# Patient Record
Sex: Male | Born: 1990 | Hispanic: No | Marital: Single | Smoking: Never smoker
Health system: Southern US, Community
[De-identification: ages and names within clinical notes are randomized; demographics above are authoritative.]

## PROBLEM LIST (undated history)

## (undated) DIAGNOSIS — J3089 Other allergic rhinitis: Secondary | ICD-10-CM

---
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
## 2016-02-28 ENCOUNTER — Emergency Department (HOSPITAL_COMMUNITY): Payer: Self-pay

## 2016-02-28 ENCOUNTER — Encounter (HOSPITAL_COMMUNITY): Payer: Self-pay

## 2016-02-28 ENCOUNTER — Emergency Department (HOSPITAL_COMMUNITY)
Admission: EM | Admit: 2016-02-28 | Discharge: 2016-02-28 | Disposition: A | Discharge status: Home / Self Care | Payer: Self-pay | Attending: Emergency Medicine | Admitting: Emergency Medicine

## 2016-02-28 ENCOUNTER — Emergency Department (HOSPITAL_COMMUNITY): Admit: 2016-02-28 | Discharge: 2016-02-28 | Discharge status: Against Medical Advice | Payer: Self-pay

## 2016-02-28 DIAGNOSIS — Y929 Unspecified place or not applicable: Secondary | ICD-10-CM | POA: Insufficient documentation

## 2016-02-28 DIAGNOSIS — X501XXA Overexertion from prolonged static or awkward postures, initial encounter: Secondary | ICD-10-CM | POA: Insufficient documentation

## 2016-02-28 DIAGNOSIS — Y999 Unspecified external cause status: Secondary | ICD-10-CM | POA: Insufficient documentation

## 2016-02-28 DIAGNOSIS — S93401A Sprain of unspecified ligament of right ankle, initial encounter: Secondary | ICD-10-CM | POA: Insufficient documentation

## 2016-02-28 DIAGNOSIS — Y939 Activity, unspecified: Secondary | ICD-10-CM | POA: Insufficient documentation

## 2016-02-28 HISTORY — DX: Other allergic rhinitis: J30.89

## 2016-02-28 MED ORDER — IBUPROFEN 800 MG PO TABS
800.0000 mg | ORAL_TABLET | Freq: Once | ORAL | Status: AC
  Administered 2016-02-28: 800 mg via ORAL
  Filled 2016-02-28: qty 1

## 2016-02-28 NOTE — ED Provider Notes (Signed)
[facility]-EMERGENCY DEPT Provider Note   CSN: [id number] Arrival date & time: 02/28/16  0357    History   Chief Complaint Chief Complaint  Patient presents with  . Ankle Pain    HPI Joseph Walsh is a 26 y.o. male.  The history is provided by the patient. No language interpreter was used.  Ankle Pain   The incident occurred 6 to 12 hours ago. Injury mechanism: twisted ankle. The pain is present in the right ankle. The quality of the pain is described as throbbing and aching. The pain is mild. The pain has been constant since onset. Pertinent negatives include no muscle weakness and no tingling. He reports no foreign bodies present. The symptoms are aggravated by bearing weight. He has tried elevation for the symptoms. The treatment provided no relief.    Past Medical History:  Diagnosis Date  . Environmental and seasonal allergies     There are no active problems to display for this patient.   History reviewed. No pertinent surgical history.   Home Medications    Prior to Admission medications   Not on File    Family History No family history on file.  Social History Social History  Substance Use Topics  . Smoking status: Never Smoker  . Smokeless tobacco: Never Used  . Alcohol use Yes     Allergies   Patient has no allergy information on record.   Review of Systems Review of Systems  Neurological: Negative for tingling.  Ten systems reviewed and are negative for acute change, except as noted in the HPI.    Physical Exam Updated Vital Signs BP 113/59 (BP Location: Right Arm)   Pulse 65   Temp 97.9 F (36.6 C) (Oral)   Resp 18   SpO2 97%   Physical Exam  Constitutional: He is oriented to person, place, and time. He appears well-developed and well-nourished. No distress.  Nontoxic and in NAD  HENT:  Head: Normocephalic and atraumatic.  Eyes: Conjunctivae and EOM are normal. No scleral icterus.  Neck: Normal range of motion.  Cardiovascular: Normal  rate, regular rhythm and intact distal pulses.   DP pulse 2+ in the RLE  Pulmonary/Chest: Effort normal. No respiratory distress.  Musculoskeletal: He exhibits tenderness.       Right ankle: He exhibits swelling. He exhibits normal range of motion, no deformity, no laceration and normal pulse. Tenderness. Achilles tendon normal.  Neurological: He is alert and oriented to person, place, and time. He exhibits normal muscle tone. Coordination normal.  Sensation to light touch intact in the RLE  Skin: Skin is warm and dry. No rash noted. He is not diaphoretic. No erythema. No pallor.  Psychiatric: He has a normal mood and affect. His behavior is normal.  Nursing note and vitals reviewed.    ED Treatments / Results  Labs (all labs ordered are listed, but only abnormal results are displayed) Labs Reviewed - No data to display  EKG  EKG Interpretation None       Radiology Dg Ankle Complete Right  Result Date: 02/28/2016 CLINICAL DATA:  Patient twisted right ankle during a fall. Lateral pain and swelling. EXAM: RIGHT ANKLE - COMPLETE 3+ VIEW COMPARISON:  None. FINDINGS: Lateral soft tissue swelling about the right ankle. No evidence of acute fracture or dislocation. No focal bone lesion or bone destruction. Bone cortex appears intact. Joint spaces are preserved. IMPRESSION: Soft tissue swelling.  No acute bony abnormalities. Electronically Signed   By: Marisa Cyphers.D.  On: 02/28/2016 04:48    Procedures Procedures (including critical care time)  Medications Ordered in ED Medications  ibuprofen (ADVIL,MOTRIN) tablet 800 mg (not administered)     Initial Impression / Assessment and Plan / ED Course  I have reviewed the triage vital signs and the nursing notes.  Pertinent labs & imaging results that were available during my care of the patient were reviewed by me and considered in my medical decision making (see chart for details).     26 year old male presents to the  emergency department for evaluation of right ankle pain after twisting his ankle tonight. Patient is neurovascularly intact. X-ray shows no evidence of fracture or bony deformity. Will manage supportively with ASO ankle brace and crutches for WBAT. Ibuprofen recommended for pain as well as icing. Return precautions discussed and provided. Patient discharged in stable condition with no unaddressed concerns.   Final Clinical Impressions(s) / ED Diagnoses   Final diagnoses:  Sprain of right ankle, unspecified ligament, initial encounter    New Prescriptions New Prescriptions   No medications on file     Antony MaduraKelly Vale Mousseau, PA-C 02/28/16 [phone]    Pricilla LovelessScott Goldston, MD 03/02/16 1724

## 2016-02-28 NOTE — ED Triage Notes (Signed)
Pt decided he wanted to be seen and wanted an xray because his ankle was hurting
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
## 2016-02-28 NOTE — ED Notes (Signed)
Pt refused xray and decided he didn't want any medical treatment

## 2018-02-21 IMAGING — CR DG ANKLE COMPLETE 3+V*R*
3 series · 3 of 3 positions shown · non-contrast
Comparison: None.

CLINICAL DATA: Patient twisted right ankle during a fall. Lateral
pain and swelling.

EXAM:
RIGHT ANKLE - COMPLETE 3+ VIEW

[x ankle ap right]
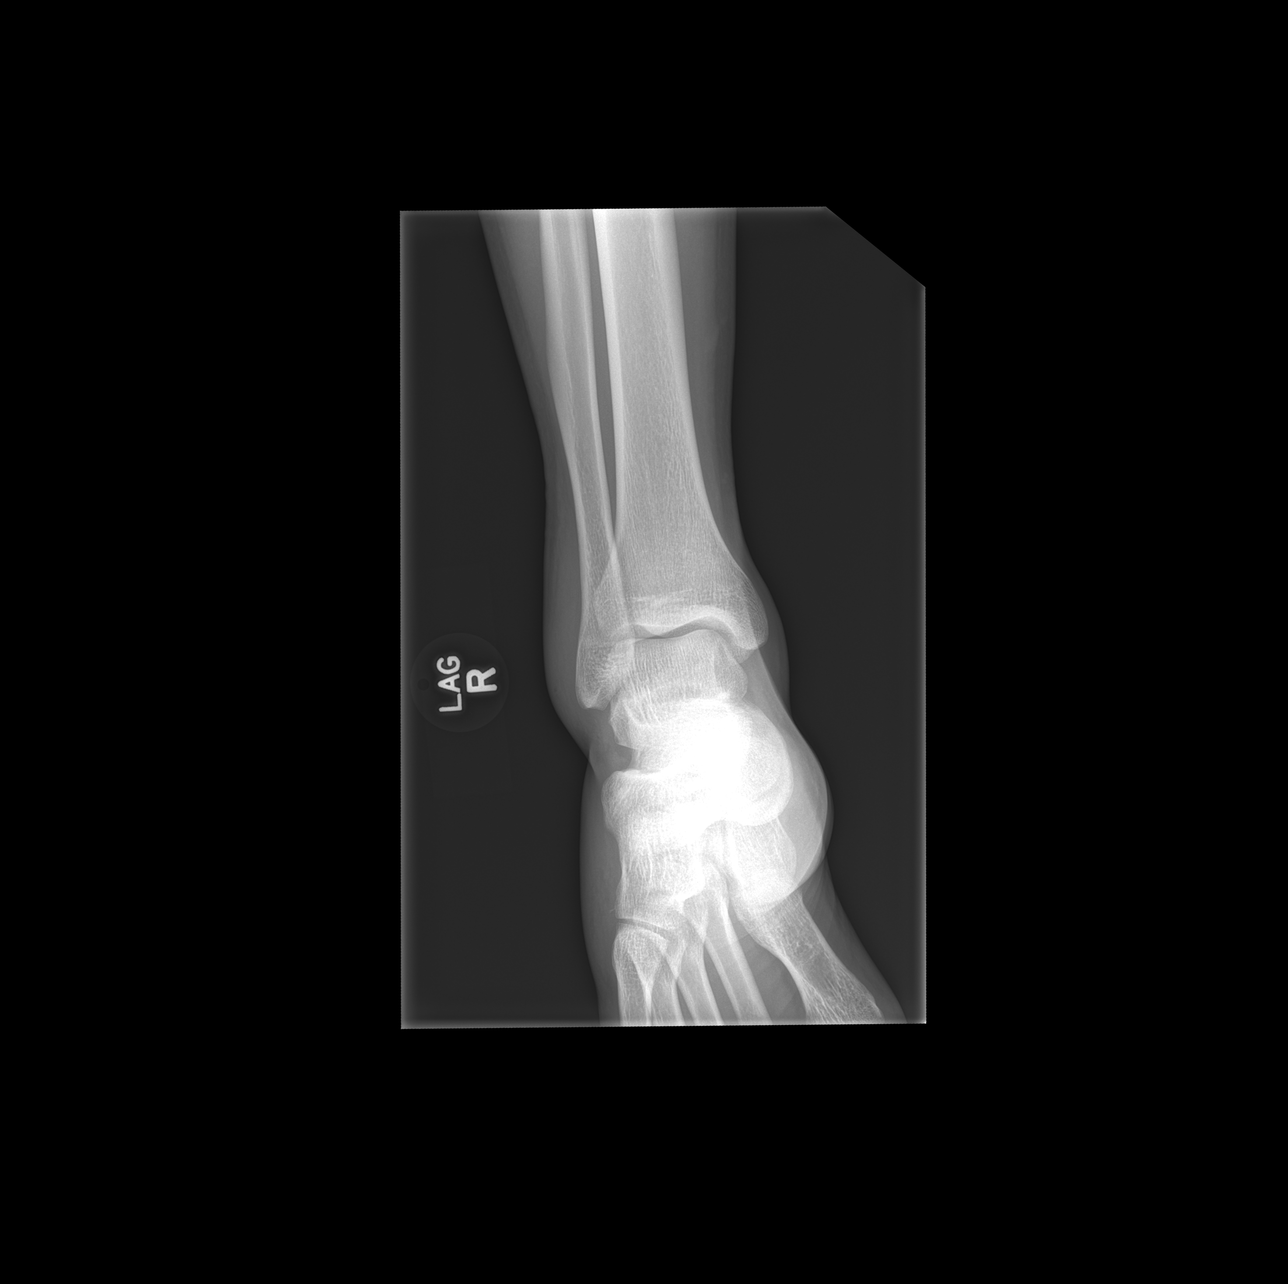

[x ankle obl right]
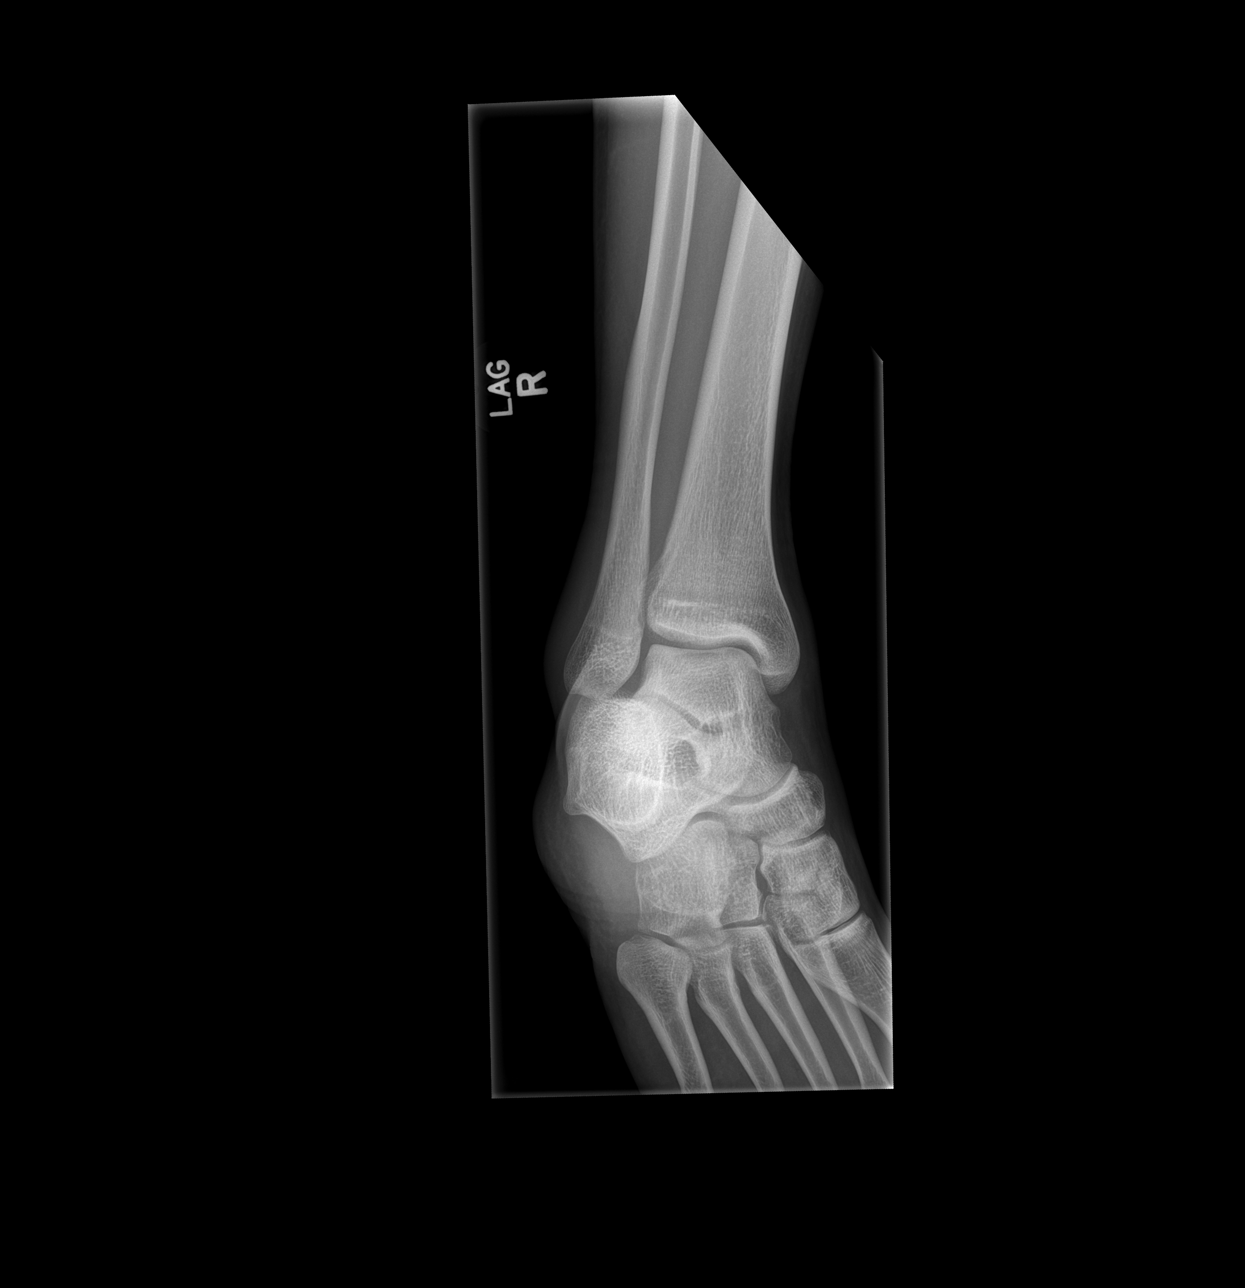

[x ankle lat right]
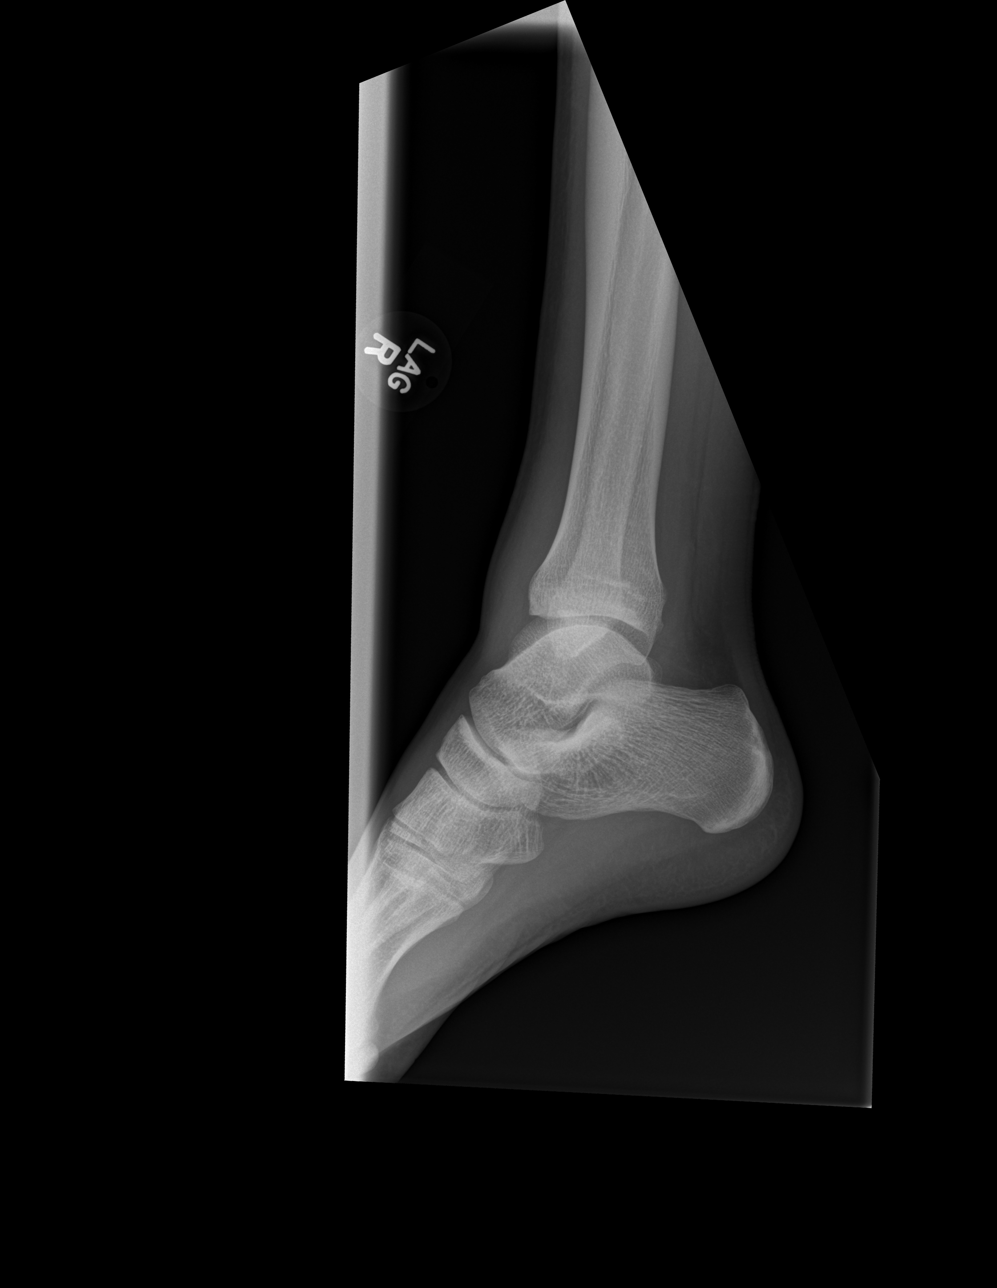

[3 of 3 positions shown; findings below may reference images not displayed]

FINDINGS: Lateral soft tissue swelling about the right ankle. No evidence of
acute fracture or dislocation. No focal bone lesion or bone
destruction. Bone cortex appears intact. Joint spaces are preserved.
IMPRESSION: Soft tissue swelling.  No acute bony abnormalities.

## 2019-04-15 ENCOUNTER — Encounter (HOSPITAL_COMMUNITY): Payer: Self-pay | Admitting: Emergency Medicine

## 2019-04-15 ENCOUNTER — Other Ambulatory Visit: Payer: Self-pay

## 2019-04-15 ENCOUNTER — Emergency Department (HOSPITAL_COMMUNITY)
Admission: EM | Admit: 2019-04-15 | Discharge: 2019-04-15 | Disposition: A | Discharge status: Home / Self Care | Payer: BC Managed Care – PPO | Attending: Emergency Medicine | Admitting: Emergency Medicine

## 2019-04-15 DIAGNOSIS — Y9289 Other specified places as the place of occurrence of the external cause: Secondary | ICD-10-CM | POA: Insufficient documentation

## 2019-04-15 DIAGNOSIS — S0083XA Contusion of other part of head, initial encounter: Secondary | ICD-10-CM

## 2019-04-15 DIAGNOSIS — S0990XA Unspecified injury of head, initial encounter: Secondary | ICD-10-CM | POA: Diagnosis present

## 2019-04-15 DIAGNOSIS — R040 Epistaxis: Secondary | ICD-10-CM | POA: Diagnosis not present

## 2019-04-15 DIAGNOSIS — Y998 Other external cause status: Secondary | ICD-10-CM | POA: Insufficient documentation

## 2019-04-15 DIAGNOSIS — Y9389 Activity, other specified: Secondary | ICD-10-CM | POA: Diagnosis not present

## 2019-04-15 DIAGNOSIS — R519 Headache, unspecified: Secondary | ICD-10-CM

## 2019-04-15 DIAGNOSIS — S0181XA Laceration without foreign body of other part of head, initial encounter: Secondary | ICD-10-CM
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
## 2019-09-29 ENCOUNTER — Other Ambulatory Visit: Payer: BC Managed Care – PPO

## 2019-09-29 ENCOUNTER — Other Ambulatory Visit: Payer: Self-pay | Admitting: Critical Care Medicine

## 2019-09-29 DIAGNOSIS — Z20822 Contact with and (suspected) exposure to covid-19: Secondary | ICD-10-CM

## 2019-10-02 LAB — NOVEL CORONAVIRUS, NAA: SARS-CoV-2, NAA: NOT DETECTED

## 2019-10-03 ENCOUNTER — Telehealth: Payer: Self-pay | Admitting: General Practice

## 2019-10-03 NOTE — Telephone Encounter (Signed)
Negative COVID results given. Patient results "NOT Detected." Caller expressed understanding. ° °

## 2020-05-30 ENCOUNTER — Ambulatory Visit: Payer: Self-pay

## 2021-01-04 ENCOUNTER — Emergency Department (HOSPITAL_COMMUNITY): Payer: BC Managed Care – PPO

## 2021-01-04 ENCOUNTER — Encounter (HOSPITAL_COMMUNITY): Payer: Self-pay

## 2021-01-04 ENCOUNTER — Emergency Department (HOSPITAL_COMMUNITY)
Admission: EM | Admit: 2021-01-04 | Discharge: 2021-01-04 | Disposition: A | Discharge status: Home / Self Care | Payer: BC Managed Care – PPO | Attending: Emergency Medicine | Admitting: Emergency Medicine

## 2021-01-04 ENCOUNTER — Emergency Department (HOSPITAL_COMMUNITY)
Admission: EM | Admit: 2021-01-04 | Discharge: 2021-01-04 | Discharge status: Against Medical Advice | Payer: BC Managed Care – PPO | Source: Home / Self Care

## 2021-01-04 DIAGNOSIS — H1033 Unspecified acute conjunctivitis, bilateral: Secondary | ICD-10-CM | POA: Diagnosis not present

## 2021-01-04 DIAGNOSIS — H5713 Ocular pain, bilateral: Secondary | ICD-10-CM | POA: Diagnosis present

## 2021-01-04 DIAGNOSIS — H109 Unspecified conjunctivitis: Secondary | ICD-10-CM

## 2021-01-04 LAB — CBC WITH DIFFERENTIAL/PLATELET
Abs Immature Granulocytes: 0.04 10*3/uL (ref 0.00–0.07)
Basophils Absolute: 0.1 10*3/uL (ref 0.0–0.1)
Basophils Relative: 1 %
Eosinophils Absolute: 0.1 10*3/uL (ref 0.0–0.5)
Eosinophils Relative: 1 %
HCT: 44.2 % (ref 39.0–52.0)
Hemoglobin: 14.5 g/dL (ref 13.0–17.0)
Immature Granulocytes: 0 %
Lymphocytes Relative: 21 %
Lymphs Abs: 2.1 10*3/uL (ref 0.7–4.0)
MCH: 28.9 pg (ref 26.0–34.0)
MCHC: 32.8 g/dL (ref 30.0–36.0)
MCV: 88 fL (ref 80.0–100.0)
Monocytes Absolute: 0.9 10*3/uL (ref 0.1–1.0)
Monocytes Relative: 9 %
Neutro Abs: 6.7 10*3/uL (ref 1.7–7.7)
Neutrophils Relative %: 68 %
Platelets: 239 10*3/uL (ref 150–400)
RBC: 5.02 MIL/uL (ref 4.22–5.81)
RDW: 14.6 % (ref 11.5–15.5)
WBC: 9.9 10*3/uL (ref 4.0–10.5)
nRBC: 0 % (ref 0.0–0.2)

## 2021-01-04 LAB — BASIC METABOLIC PANEL
Anion gap: 12 (ref 5–15)
BUN: 5 mg/dL — ABNORMAL LOW (ref 6–20)
CO2: 23 mmol/L (ref 22–32)
Calcium: 9.2 mg/dL (ref 8.9–10.3)
Chloride: 107 mmol/L (ref 98–111)
Creatinine, Ser: 1.1 mg/dL (ref 0.61–1.24)
GFR, Estimated: >60 mL/min (ref >60)
Glucose, Bld: 93 mg/dL (ref 70–99)
Potassium: 4.5 mmol/L (ref 3.5–5.1)
Sodium: 142 mmol/L (ref 135–145)

## 2021-01-04 MED ORDER — OFLOXACIN 0.3 % OP SOLN
2.0000 [drp] | Freq: Once | OPHTHALMIC | Status: AC
  Administered 2021-01-04: 2 [drp] via OPHTHALMIC
  Filled 2021-01-04: qty 5

## 2021-01-04 MED ORDER — FLUORESCEIN SODIUM 1 MG OP STRP
1.0000 | ORAL_STRIP | Freq: Once | OPHTHALMIC | Status: AC
  Administered 2021-01-04: 1 via OPHTHALMIC
  Filled 2021-01-04: qty 1

## 2021-01-04 MED ORDER — TETRACAINE HCL 0.5 % OP SOLN
2.0000 [drp] | Freq: Once | OPHTHALMIC | Status: AC
  Administered 2021-01-04: 2 [drp] via OPHTHALMIC
  Filled 2021-01-04: qty 4

## 2021-01-04 MED ORDER — IBUPROFEN 400 MG PO TABS
600.0000 mg | ORAL_TABLET | Freq: Once | ORAL | Status: AC
  Administered 2021-01-04: 600 mg via ORAL
  Filled 2021-01-04: qty 1

## 2021-01-04 MED ORDER — IOHEXOL 300 MG/ML  SOLN
80.0000 mL | Freq: Once | INTRAMUSCULAR | Status: AC | PRN
  Administered 2021-01-04: 75 mL via INTRAVENOUS

## 2021-01-04 MED ORDER — OXYCODONE-ACETAMINOPHEN 5-325 MG PO TABS
1.0000 | ORAL_TABLET | Freq: Once | ORAL | Status: AC
  Administered 2021-01-04: 1 via ORAL
  Filled 2021-01-04: qty 1

## 2021-01-04 MED ORDER — OFLOXACIN 0.3 % OP SOLN: 1.0000 [drp] | Freq: Four times a day (QID) | OPHTHALMIC | 0 refills | Status: AC

## 2021-01-04 NOTE — ED Notes (Signed)
Pt was only able to see the top line on visual acuity test but told us that that was normal for him without his galsses. EDP aware

## 2021-01-04 NOTE — Discharge Instructions (Addendum)
Follow-up with Dr. Dione Booze Monday morning.  However if your symptoms worsen if you have worsening pain or the redness increases or you have worsening vision call the ophthalmologist immediately this weekend.  Return immediately back to the ER if:  Your symptoms worsen within the next 12-24 hours. You develop new symptoms such as new fevers, persistent vomiting, new pain, shortness of breath, or new weakness or numbness, or if you have any other concerns.

## 2021-01-04 NOTE — ED Triage Notes (Signed)
Pt states that this morning he woke up with redness and swelling to both eyes, drainage from both and difficulty seeing from the L eye

## 2021-01-04 NOTE — ED Provider Notes (Signed)
[facility] EMERGENCY DEPARTMENT Provider Note   CSN: [id number] Arrival date & time: 01/04/21  0153     History Chief Complaint  Patient presents with   Eye Pain    Joseph Walsh is a 30 y.o. male.  Patient presents with chief complaint of bilateral eye pain and redness and blurry vision.  He states that he woke up with some swelling and pain to the left eye.  He continued to diabetic with a because of try to wash it but symptoms continued.  And then symptoms transfer to his right as well.  Continue to worsen throughout the day.  He presents to the ER today because he has been having discharge from bilateral eyes.  Otherwise denies any fevers or cough no vomiting or diarrhea.  Does not wear any contacts but does wear regular glasses.  He works at [employer] otherwise.      Past Medical History:  Diagnosis Date   Environmental and seasonal allergies     There are no problems to display for this patient.   History reviewed. No pertinent surgical history.     No family history on file.  Social History   Tobacco Use   Smoking status: Never   Smokeless tobacco: Never  Substance Use Topics   Alcohol use: Yes    Home Medications Prior to Admission medications   Medication Sig Start Date End Date Taking? Authorizing Provider  ofloxacin (OCUFLOX) 0.3 % ophthalmic solution Place 1 drop into both eyes 4 (four) times daily for 7 days. 01/04/21 01/11/21 Yes Ciana Simmon, Eustace Moore, MD    Allergies    Cinnamon and Codeine  Review of Systems   Review of Systems  Constitutional:  Negative for fever.  HENT:  Negative for ear pain and sore throat.   Eyes:  Positive for pain, discharge and redness.  Respiratory:  Negative for cough.   Cardiovascular:  Negative for chest pain.  Gastrointestinal:  Negative for abdominal pain.  Genitourinary:  Negative for flank pain.  Musculoskeletal:  Negative for back pain.  Skin:  Negative for color change and rash.  Neurological:   Negative for syncope.  All other systems reviewed and are negative.  Physical Exam Updated Vital Signs BP 108/67    Pulse 69    Temp 98.1 F (36.7 C) (Oral)    Resp 14    SpO2 98%   Physical Exam Constitutional:      Appearance: He is well-developed.  HENT:     Head: Normocephalic.     Nose: Nose normal.  Eyes:     Extraocular Movements: Extraocular movements intact.     Pupils: Pupils are equal, round, and reactive to light.     Comments: Bilateral conjunctiva appear injected.  He has purulent discharge seen on bilateral eyelashes.  Pupils equal reactive to light otherwise.  Patient has pain with extraocular motion.  Cardiovascular:     Rate and Rhythm: Normal rate.  Pulmonary:     Effort: Pulmonary effort is normal.  Skin:    Coloration: Skin is not jaundiced.  Neurological:     Mental Status: He is alert. Mental status is at baseline.    ED Results / Procedures / Treatments   Labs (all labs ordered are listed, but only abnormal results are displayed) Labs Reviewed  BASIC METABOLIC PANEL - Abnormal; Notable for the following components:      Result Value   BUN 5 (*)    All other components within normal limits  CBC  WITH DIFFERENTIAL/PLATELET    EKG None  Radiology CT Orbits W Contrast  Result Date: 01/04/2021 CLINICAL DATA:  Bilateral eye redness, evaluate cellulitis EXAM: CT ORBITS WITH CONTRAST TECHNIQUE: Multidetector CT images was performed according to the standard protocol following intravenous contrast administration. CONTRAST:  56mL OMNIPAQUE IOHEXOL 300 MG/ML  SOLN COMPARISON:  No pertinent prior exam. FINDINGS: Orbits: Hyperenhancing conjunctiva bilaterally. Normal appearance of the globe itself, optic nerve sheath complexes, vessels, lacrimal glands, extraocular muscles. No postseptal inflammatory changes. Visible paranasal sinuses: Mild patchy mucosal thickening without fluid levels. Soft tissues: Negative for abscess. 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 No acute finding. IMPRESSION: Bilateral conjunctivitis.  No postseptal inflammation. Electronically Signed   By: Tiburcio Pea M.D.   On: 01/04/2021 07:26    Procedures Procedures   Medications Ordered in ED Medications  ofloxacin (OCUFLOX) 0.3 % ophthalmic solution 2 drop (has no administration in time range)  ibuprofen (ADVIL) tablet 600 mg (600 mg Oral Given 01/04/21 0635)  oxyCODONE-acetaminophen (PERCOCET/ROXICET) 5-325 MG per tablet 1 tablet (1 tablet Oral Given 01/04/21 2111)  fluorescein ophthalmic strip 1 strip (1 strip Both Eyes Given 01/04/21 0614)  tetracaine (PONTOCAINE) 0.5 % ophthalmic solution 2 drop (2 drops Both Eyes Given 01/04/21 0614)  iohexol (OMNIPAQUE) 300 MG/ML solution 80 mL (75 mLs Intravenous Contrast Given 01/04/21 7356)    ED Course  I have reviewed the triage vital signs and the nursing notes.  Pertinent labs & imaging results that were available during my care of the patient were reviewed by me and considered in my medical decision making (see chart for details).    MDM Rules/Calculators/A&P                         Basic labs and sent and CT orbits ordered.  Results are pending.  Tono-Pen pressures of the left eye are 20, 21 and 23.  Tono-Pen pressures of the right are are 18, 19 and 18.  Visual acuity is only to the big letter E, however the patient says he does not have his glasses with him and that he typically wears.  However he states his vision feels like his baseline without glasses. Labs are unremarkable CT of the orbit showed no evidence of orbital cellulitis.  Case discussed with on-call ophthalmologist who advises eyedrops and outpatient follow-up in their office Monday.  Patient advised to call ophthalmology tomorrow however if his eye worsens or if he has worsening pain to not wait until Monday.       Final Clinical Impression(s) / ED Diagnoses Final diagnoses:   Bacterial conjunctivitis of both eyes    Rx / DC Orders ED Discharge Orders          Ordered    ofloxacin (OCUFLOX) 0.3 % ophthalmic solution  4 times daily        01/04/21 0755             Cheryll Cockayne, MD 01/04/21 [phone]
